# Patient Record
Sex: Female | Born: 1955 | Race: White | Hispanic: No | Marital: Single | Smoking: Never smoker
Health system: Southern US, Community
[De-identification: ages and names within clinical notes are randomized; demographics above are authoritative.]

---

## 1985-11-07 DIAGNOSIS — D229 Melanocytic nevi, unspecified: Secondary | ICD-10-CM

## 1985-11-07 HISTORY — DX: Melanocytic nevi, unspecified: D22.9

## 2017-02-01 DIAGNOSIS — C4491 Basal cell carcinoma of skin, unspecified: Secondary | ICD-10-CM

## 2017-02-01 HISTORY — DX: Basal cell carcinoma of skin, unspecified: C44.91

## 2019-05-22 DIAGNOSIS — C439 Malignant melanoma of skin, unspecified: Secondary | ICD-10-CM

## 2019-05-22 HISTORY — DX: Malignant melanoma of skin, unspecified: C43.9

## 2019-10-24 ENCOUNTER — Other Ambulatory Visit: Payer: Self-pay

## 2019-10-24 ENCOUNTER — Encounter: Payer: Self-pay | Admitting: Dermatology

## 2019-10-24 ENCOUNTER — Ambulatory Visit: Payer: BC Managed Care – PPO | Admitting: Dermatology

## 2019-10-24 DIAGNOSIS — Z86018 Personal history of other benign neoplasm: Secondary | ICD-10-CM

## 2019-10-24 DIAGNOSIS — Z85820 Personal history of malignant melanoma of skin: Secondary | ICD-10-CM

## 2019-10-24 DIAGNOSIS — D225 Melanocytic nevi of trunk: Secondary | ICD-10-CM | POA: Diagnosis not present

## 2019-10-24 DIAGNOSIS — D229 Melanocytic nevi, unspecified: Secondary | ICD-10-CM

## 2019-10-24 NOTE — Progress Notes (Signed)
   Follow-Up Visit   Subjective  Jacqueline Garcia is a 64 y.o. female who presents for the following: Follow-up (3 month follow up on right upper back-healing great).  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  General skin examination performed. No sign recurrent/new melanoma, no atypical moles   Assessment & Plan  Nevus (3) Left Upper Back; Right Upper Back; Mid Back  Recheck q41months x48yrs then annually. Patient to self examine skin twice annually. Sun protect.

## 2019-10-26 ENCOUNTER — Encounter: Payer: Self-pay | Admitting: Dermatology

## 2020-10-01 ENCOUNTER — Encounter: Payer: Self-pay | Admitting: Dermatology

## 2020-10-01 ENCOUNTER — Other Ambulatory Visit: Payer: Self-pay

## 2020-10-01 ENCOUNTER — Ambulatory Visit: Payer: BC Managed Care – PPO | Admitting: Dermatology

## 2020-10-01 DIAGNOSIS — Z86018 Personal history of other benign neoplasm: Secondary | ICD-10-CM

## 2020-10-01 DIAGNOSIS — L821 Other seborrheic keratosis: Secondary | ICD-10-CM

## 2020-10-01 DIAGNOSIS — Z87898 Personal history of other specified conditions: Secondary | ICD-10-CM

## 2020-10-01 DIAGNOSIS — Z85828 Personal history of other malignant neoplasm of skin: Secondary | ICD-10-CM

## 2020-10-01 DIAGNOSIS — Z85820 Personal history of malignant melanoma of skin: Secondary | ICD-10-CM

## 2020-10-01 DIAGNOSIS — D1801 Hemangioma of skin and subcutaneous tissue: Secondary | ICD-10-CM

## 2020-10-01 DIAGNOSIS — Z1283 Encounter for screening for malignant neoplasm of skin: Secondary | ICD-10-CM | POA: Diagnosis not present

## 2020-10-01 DIAGNOSIS — L859 Epidermal thickening, unspecified: Secondary | ICD-10-CM

## 2020-10-01 NOTE — Patient Instructions (Signed)
10% urea cream over the counter for elbows or amlactin lotion

## 2020-10-19 ENCOUNTER — Encounter: Payer: Self-pay | Admitting: Dermatology

## 2020-10-19 NOTE — Progress Notes (Signed)
   Follow-Up Visit   Subjective  Jacqueline Garcia is a 65 y.o. female who presents for the following: Annual Exam (Patient here today for yearly skin check. No new concerns).  General skin examination Location:  Duration:  Quality:  Associated Signs/Symptoms: Modifying Factors:  Severity:  Timing: Context:   Objective  Well appearing patient in no apparent distress; mood and affect are within normal limits. Objective  Right Upper Back: Previous melanoma Skin and Lymph nodes are clear per Dr. Denna Haggard  Objective  Chest - Medial [facility]): Full body skin check. No atypical moles, no skin cancer today. Many moles on face and neck. Facial lesion patient picks, sometimes bleeds, deferred treatment this visit  Objective  Mid Back: See previous history: No atypical pigmented lesions.  No recurrence.  Objective  Right Upper Back: 2018 right upper back: No sign recurrence  Objective  Left Upper Back: On back and chest: Flattopped brown textured 3 to 5 mm papules  Objective  Left Lower Back: On back, left outer eye lid, legs: Smooth 1 to 2 mm red papules  Objective  Left Elbow - Posterior, Right Elbow - Posterior: Moderate hyperkeratosis without much inflammation both elbows.  No other cutaneous or nail signs of psoriasis.   A full examination was performed including scalp, head, eyes, ears, nose, lips, neck, chest, axillae, abdomen, back, buttocks, bilateral upper extremities, bilateral lower extremities, hands, feet, fingers, toes, fingernails, and toenails. All findings within normal limits unless otherwise noted below.  Areas beneath undergarments not examined.   Assessment & Plan    Personal history of malignant melanoma of skin Right Upper Back  Annual skin examination, patient encouraged to self examine twice annually, continued ultraviolet protection.  Screening exam for skin cancer Chest - Medial [facility])  Annual skin examination.  Patient reassured that  picking will not change a mole into cancer.  History of atypical nevus Mid Back  Annual skin examination  History of basal cell carcinoma (BCC) Right Upper Back  Recheck as needed change  Seborrheic keratosis Left Upper Back  No intervention currently indicated  Cherry angioma Left Lower Back  No intervention necessary  Hyperkeratosis (2) Left Elbow - Posterior; Right Elbow - Posterior  May try over-the-counter 10% urea cream daily after bathing.     I, Lavonna Monarch, MD, have reviewed all documentation for this visit.  The documentation on 10/19/20 for the exam, diagnosis, procedures, and orders are all accurate and complete.

## 2021-03-08 ENCOUNTER — Encounter (HOSPITAL_BASED_OUTPATIENT_CLINIC_OR_DEPARTMENT_OTHER): Payer: Self-pay | Admitting: Emergency Medicine

## 2021-03-08 ENCOUNTER — Other Ambulatory Visit: Payer: Self-pay

## 2021-03-08 ENCOUNTER — Emergency Department (HOSPITAL_BASED_OUTPATIENT_CLINIC_OR_DEPARTMENT_OTHER)
Admission: EM | Admit: 2021-03-08 | Discharge: 2021-03-08 | Disposition: A | Discharge status: Home / Self Care | Payer: BC Managed Care – PPO | Attending: Emergency Medicine | Admitting: Emergency Medicine

## 2021-03-08 ENCOUNTER — Emergency Department (HOSPITAL_BASED_OUTPATIENT_CLINIC_OR_DEPARTMENT_OTHER): Payer: BC Managed Care – PPO

## 2021-03-08 DIAGNOSIS — J3489 Other specified disorders of nose and nasal sinuses: Secondary | ICD-10-CM | POA: Diagnosis not present

## 2021-03-08 DIAGNOSIS — Z85828 Personal history of other malignant neoplasm of skin: Secondary | ICD-10-CM | POA: Diagnosis not present

## 2021-03-08 DIAGNOSIS — J069 Acute upper respiratory infection, unspecified: Secondary | ICD-10-CM | POA: Diagnosis not present

## 2021-03-08 DIAGNOSIS — R059 Cough, unspecified: Secondary | ICD-10-CM | POA: Diagnosis present

## 2021-03-08 MED ORDER — AZITHROMYCIN 250 MG PO TABS: 250.0000 mg | ORAL_TABLET | Freq: Every day | ORAL | 0 refills | Status: DC

## 2021-03-08 MED ORDER — AZITHROMYCIN 250 MG PO TABS: 500.0000 mg | ORAL_TABLET | Freq: Once | ORAL | Status: DC

## 2021-03-08 MED ORDER — BENZONATATE 100 MG PO CAPS: 100.0000 mg | ORAL_CAPSULE | Freq: Three times a day (TID) | ORAL | 0 refills | Status: AC

## 2021-03-08 MED ORDER — AZITHROMYCIN 250 MG PO TABS
250.0000 mg | ORAL_TABLET | Freq: Every day | ORAL | 0 refills | Status: AC
Start: 2021-03-08 — End: 2021-03-12

## 2021-03-08 MED ORDER — DOXYCYCLINE HYCLATE 100 MG PO CAPS: 100.0000 mg | ORAL_CAPSULE | Freq: Two times a day (BID) | ORAL | 0 refills | Status: AC

## 2021-03-08 NOTE — ED Triage Notes (Signed)
Pt arrives pov with c/o cough x 2 weeks. Reports nasal congestion as well. Pt denies CP

## 2021-03-08 NOTE — ED Notes (Addendum)
Cough x 2 weeks, congestion

## 2021-03-08 NOTE — ED Provider Notes (Signed)
[facility] EMERGENCY DEPARTMENT Provider Note   CSN: [id number] Arrival date & time: 03/08/21  1447     History Chief Complaint  Patient presents with   Cough    Jacqueline Garcia is a 66 y.o. female.  65 y.o female with a PMH of Melanoma presents to the ED with a chief complaint of cough that is been ongoing for the past 3 weeks. Patient describes a cough as productive, with some green sputum.  Reports the coughing fits make it difficult for her to take a deep breath.  Therefore she endorses some shortness of breath that occurs when the coughing fits happen.  She has been taking DayQuil, NyQuil for symptomatic control without much improvement in her symptoms.  She does endorse some traveling, stating she went to [place], and on a cruise but did not come in contact with anyone sick that she is aware of.  In addition she also endorses some nasal congestion, sore throat.  She has taken 2 COVID tests at home, which have both been negative.  In addition, she is vaccinated against COVID-19 along with booster.  No fever, chest pain, leg swelling or other complaints.     The history is provided by the patient and medical records.  Cough Cough characteristics:  Dry and productive Sputum characteristics:  Green Severity:  Moderate Onset quality:  Gradual Duration:  3 weeks Timing:  Constant Progression:  Unchanged Chronicity:  New Smoker: no   Context: not animal exposure, not sick contacts and not upper respiratory infection   Relieved by:  Nothing Associated symptoms: rhinorrhea   Associated symptoms: no chest pain, no fever, no headaches, no myalgias and no sore throat       Past Medical History:  Diagnosis Date   Atypical mole 11/07/1985   slight left lower back   Atypical nevi 11/07/1985   slight right mid back   Atypical nevi 11/07/1985   slight right med chest   Atypical nevi 01/25/1996   slight right lower leg   Basal cell carcinoma 02/01/2017   BCC  Superficial Right Back TX CURET CAUTERY 5FU   Melanoma ([place]) 05/22/2019   MM II- Right upper back- exc    There are no problems to display for this patient.   History reviewed. No pertinent surgical history.   OB History   No obstetric history on file.     History reviewed. No pertinent family history.  Social History   Tobacco Use   Smoking status: Never   Smokeless tobacco: Never  Vaping Use   Vaping Use: Never used  Substance Use Topics   Alcohol use: Never   Drug use: Never    Home Medications Prior to Admission medications   Medication Sig Start Date End Date Taking? Authorizing Provider  benzonatate (TESSALON) 100 MG capsule Take 1 capsule (100 mg total) by mouth every 8 (eight) hours for 7 days. 03/08/21 03/15/21 Yes Dimonique Bourdeau, Beverley Fiedler, PA-C  doxycycline (VIBRAMYCIN) 100 MG capsule Take 1 capsule (100 mg total) by mouth 2 (two) times daily for 7 days. 03/08/21 03/15/21 Yes Edouard Gikas, PA-C  azithromycin (ZITHROMAX) 250 MG tablet Take 1 tablet (250 mg total) by mouth daily for 4 days. Take first 2 tablets together, then 1 every day until finished. 03/08/21 03/12/21  Janeece Fitting, PA-C  Multiple Vitamin (QUINTABS) TABS Take 1 tablet by mouth daily.    [provider]    Allergies    Patient has no known allergies.  Review of Systems  Review of Systems  Constitutional:  Negative for fever.  HENT:  Positive for rhinorrhea. Negative for sore throat.   Respiratory:  Positive for cough.   Cardiovascular:  Negative for chest pain.  Musculoskeletal:  Negative for myalgias.  Neurological:  Negative for headaches.   Physical Exam Updated Vital Signs BP (!) 151/77 (BP Location: Left Arm)   Pulse 72   Temp 99.1 F (37.3 C) (Oral)   Resp 18   Ht '5\' 4"'$  (1.626 m)   Wt 108.9 kg   SpO2 98%   BMI 41.20 kg/m   Physical Exam Vitals and nursing note reviewed.  Constitutional:      Appearance: Normal appearance. She is not ill-appearing.     Comments: Non toxic  appearance  HENT:     Head: Normocephalic and atraumatic.     Nose: Nose normal.     Right Turbinates: Not enlarged.     Left Turbinates: Enlarged.     Right Sinus: No maxillary sinus tenderness or frontal sinus tenderness.     Left Sinus: No maxillary sinus tenderness or frontal sinus tenderness.     Mouth/Throat:     Mouth: Mucous membranes are moist.     Comments: Oropharynx appears erythematous, without any tonsillar exudate, uvula is midline.  No PTA noted. Eyes:     Pupils: Pupils are equal, round, and reactive to light.  Cardiovascular:     Rate and Rhythm: Normal rate.  Pulmonary:     Effort: Pulmonary effort is normal.     Breath sounds: Normal breath sounds. No wheezing or rales.  Abdominal:     General: Abdomen is flat.     Tenderness: There is no abdominal tenderness.  Musculoskeletal:     Cervical back: Normal range of motion and neck supple.  Skin:    General: Skin is warm and dry.  Neurological:     Mental Status: She is alert and oriented to person, place, and time.    ED Results / Procedures / Treatments   Labs (all labs ordered are listed, but only abnormal results are displayed) Labs Reviewed - No data to display  EKG None  Radiology DG Chest 2 View  Result Date: 03/08/2021 CLINICAL DATA:  Cough for 3 weeks. EXAM: CHEST - 2 VIEW COMPARISON:  None. FINDINGS: Heart size and mediastinal contours are within normal limits. Lungs are clear. No pleural effusion or pneumothorax is seen. Osseous structures about the chest are unremarkable. IMPRESSION: No active cardiopulmonary disease. No evidence of pneumonia or pulmonary edema. Electronically Signed   By: Franki Cabot M.D.   On: 03/08/2021 16:28    Procedures Procedures   Medications Ordered in ED Medications - No data to display   ED Course  I have reviewed the triage vital signs and the nursing notes.  Pertinent labs & imaging results that were available during my care of the patient were reviewed by  me and considered in my medical decision making (see chart for details).    MDM Rules/Calculators/A&P   Patient with no pertinent past medical history presents to the ED with a chief complaint of cough for the past 3 weeks.  Patient Dors is a productive cough with some green sputum.  Has done a decent amount of traveling in the last couple months including [place], [place] O[place] reports she has not been in contact with anyone sick that she is aware of.  She is COVID vaccinated along with boosted.  She does not endorse a fever, or chest pain but  has had some shortness of breath when the coughing fits occur.  She is tried over-the-counter measurements without improvement in symptoms.  She arrived in the ED with stable vital signs, temperature is 99.1, reports she is had no fevers at home.  Oxygen saturation is 98% on room air, she is able to speak in full sentences without any tachypnea present.  Lungs are clear to auscultation without any obvious crackles or rales noted.  There is no sinus pressure, oropharynx is erythematous without any tonsillar exudate or PTA.  We discussed chest x-ray to further evaluate and rule out pneumonia. X-rays without any signs of pneumonia, no pneumothorax, no other signs of infection.  Vitals are stable, she is not tachycardic or hypoxic with no history of prior blood clots.  Patient symptoms have been ongoing for the past 3 weeks, with some green sputum.  We will treat for CAP this time with along with Tessalon Perles to help with symptomatic treatment of her cough.She is agreeable of plan and management.  Patient stable for discharge.   Portions of this note were generated with [employer]. Dictation errors may occur despite best attempts at proofreading.  Final Clinical Impression(s) / ED Diagnoses Final diagnoses:  Acute upper respiratory infection    Rx / DC Orders ED Discharge Orders          Ordered    azithromycin (ZITHROMAX) 250 MG tablet  Daily,    Status:  Discontinued        03/08/21 1640    benzonatate (TESSALON) 100 MG capsule  Every 8 hours        03/08/21 1640    azithromycin (ZITHROMAX) 250 MG tablet  Daily        03/08/21 1642    doxycycline (VIBRAMYCIN) 100 MG capsule  2 times daily        03/08/21 1642             Janeece Fitting, PA-C 03/08/21 1643    Long, Wonda Olds, MD 03/09/21 [phone]

## 2021-03-08 NOTE — Discharge Instructions (Addendum)
X-ray of your chest did not show any signs of pneumonia.  You may continue to treat your cough with symptomatic medication.  I have prescribed a short course of medication to help with the cough.

## 2021-10-08 ENCOUNTER — Other Ambulatory Visit: Payer: Self-pay

## 2021-10-08 ENCOUNTER — Ambulatory Visit: Payer: BC Managed Care – PPO | Admitting: Dermatology

## 2021-10-08 DIAGNOSIS — Z1283 Encounter for screening for malignant neoplasm of skin: Secondary | ICD-10-CM

## 2021-10-08 DIAGNOSIS — Z86018 Personal history of other benign neoplasm: Secondary | ICD-10-CM

## 2021-10-08 DIAGNOSIS — L821 Other seborrheic keratosis: Secondary | ICD-10-CM

## 2021-10-08 DIAGNOSIS — Z85828 Personal history of other malignant neoplasm of skin: Secondary | ICD-10-CM

## 2021-10-08 DIAGNOSIS — B079 Viral wart, unspecified: Secondary | ICD-10-CM

## 2021-10-08 DIAGNOSIS — D1801 Hemangioma of skin and subcutaneous tissue: Secondary | ICD-10-CM

## 2021-10-08 DIAGNOSIS — Z85820 Personal history of malignant melanoma of skin: Secondary | ICD-10-CM

## 2021-10-08 DIAGNOSIS — Z87898 Personal history of other specified conditions: Secondary | ICD-10-CM

## 2021-10-08 NOTE — Patient Instructions (Addendum)
ENJOY YOUR TRIP!!!!!! ? ?[address] DR. TAFEEN STATED, A WIDE BRIM HAT AND SUN PROTECTIVE CLOTHING. ?

## 2021-10-21 ENCOUNTER — Encounter: Payer: Self-pay | Admitting: Dermatology

## 2021-10-21 NOTE — Progress Notes (Signed)
? ?  Follow-Up Visit ?  ?Subjective  ?Jacqueline Garcia is a 66 y.o. female who presents for the following: Annual Exam (Pt here for annual exam. Pt has hx of MM. Pt has no other concerns). ? ?General skin examination, history of melanoma and nonmelanoma skin cancers ?Location:  ?Duration:  ?Quality:  ?Associated Signs/Symptoms: ?Modifying Factors:  ?Severity:  ?Timing: ?Context:  ? ?Objective  ?Well appearing patient in no apparent distress; mood and affect are within normal limits. ?Full Body ?General skin examination, no atypical pigmented lesions or nonmelanoma skin cancer. ? ?Dozen 3 to 10 mm flattopped brown textured papules with typical dermoscopy ? ?Multiple 1 to 2 mm smooth red dermal papules ? ?Right Upper Back ?No sign repigmentation.  No regional adenopathy. ? ?Right Upper Back ?White scar, no sign of residual BCC ? ?Mid Back ?All pigmented spots checked with dermoscopy.  None atypical. ? ?Left Dorsal Hand ?Verrucous 4 mm flesh-colored papule ? ? ? ?A full examination was performed including scalp, head, eyes, ears, nose, lips, neck, chest, axillae, abdomen, back, buttocks, bilateral upper extremities, bilateral lower extremities, hands, feet, fingers, toes, fingernails, and toenails. All findings within normal limits unless otherwise noted below.  Areas beneath undergarments not fully examined. ? ? ?Assessment & Plan  ? ? ?Screening exam for skin cancer ?Full Body ? ?Annual skin examination.  Encouraged to self examine twice annually.  Continued ultraviolet protection. ? ?Personal history of malignant melanoma of skin ?Right Upper Back ? ?Annual skin examination ? ?History of basal cell carcinoma ?Right Upper Back ? ?Check.  As needed change ? ?History of atypical nevus ?Mid Back ? ?Annual examination ? ?Seborrheic keratosis ? ?Leave if stable ? ?Cherry angioma ? ?No intervention indicated ? ?Verruca vulgaris ?Left Dorsal Hand ? ?May try home freezing or leave if stable. ? ? ? ? ? ?I, Lavonna Monarch, MD,  have reviewed all documentation for this visit.  The documentation on 10/21/21 for the exam, diagnosis, procedures, and orders are all accurate and complete. ?

## 2022-03-28 IMAGING — DX DG CHEST 2V
2 series · 2 of 2 positions shown · non-contrast
Comparison: None.

CLINICAL DATA: Cough for 3 weeks.

EXAM:
CHEST - 2 VIEW

[chest pa]
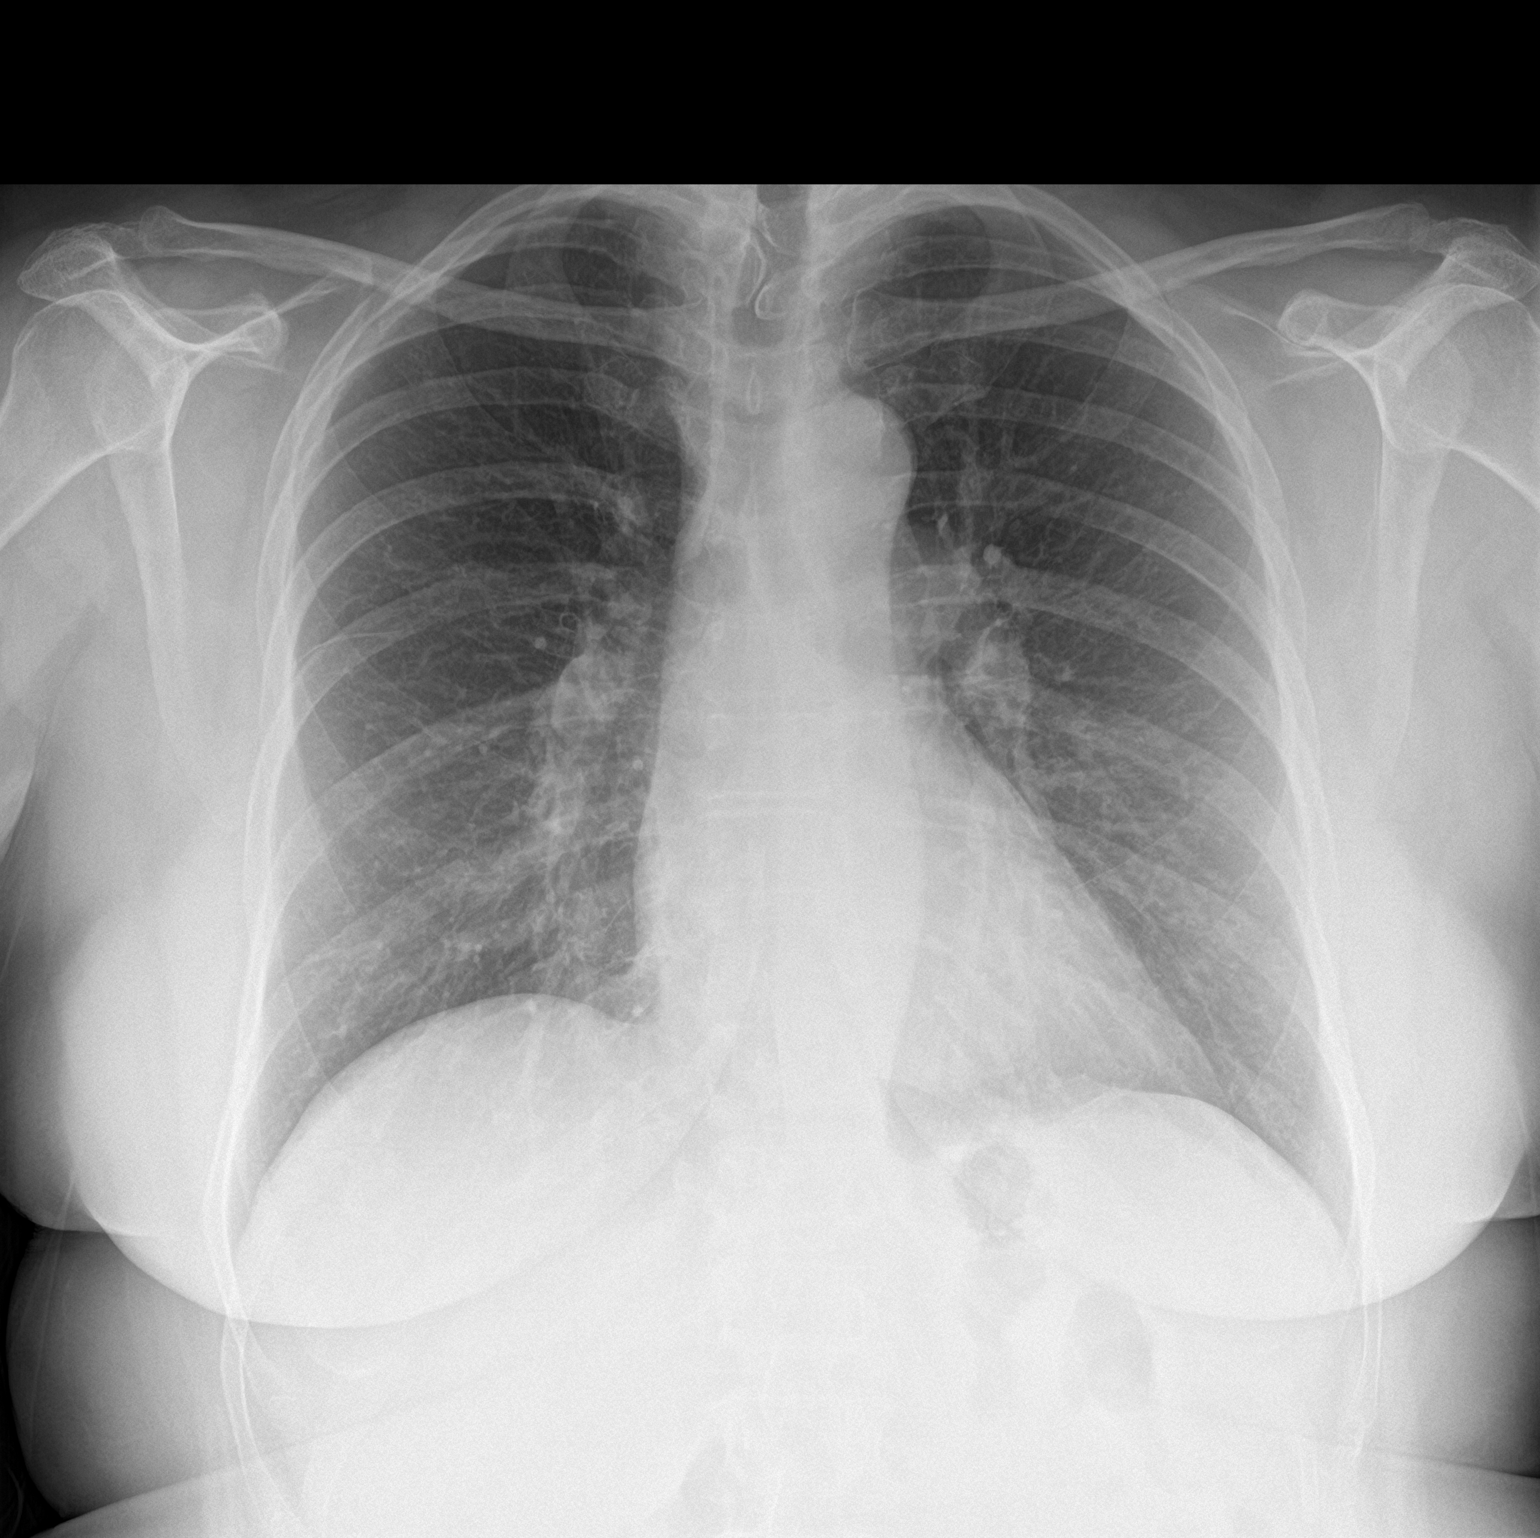

[chest lat]
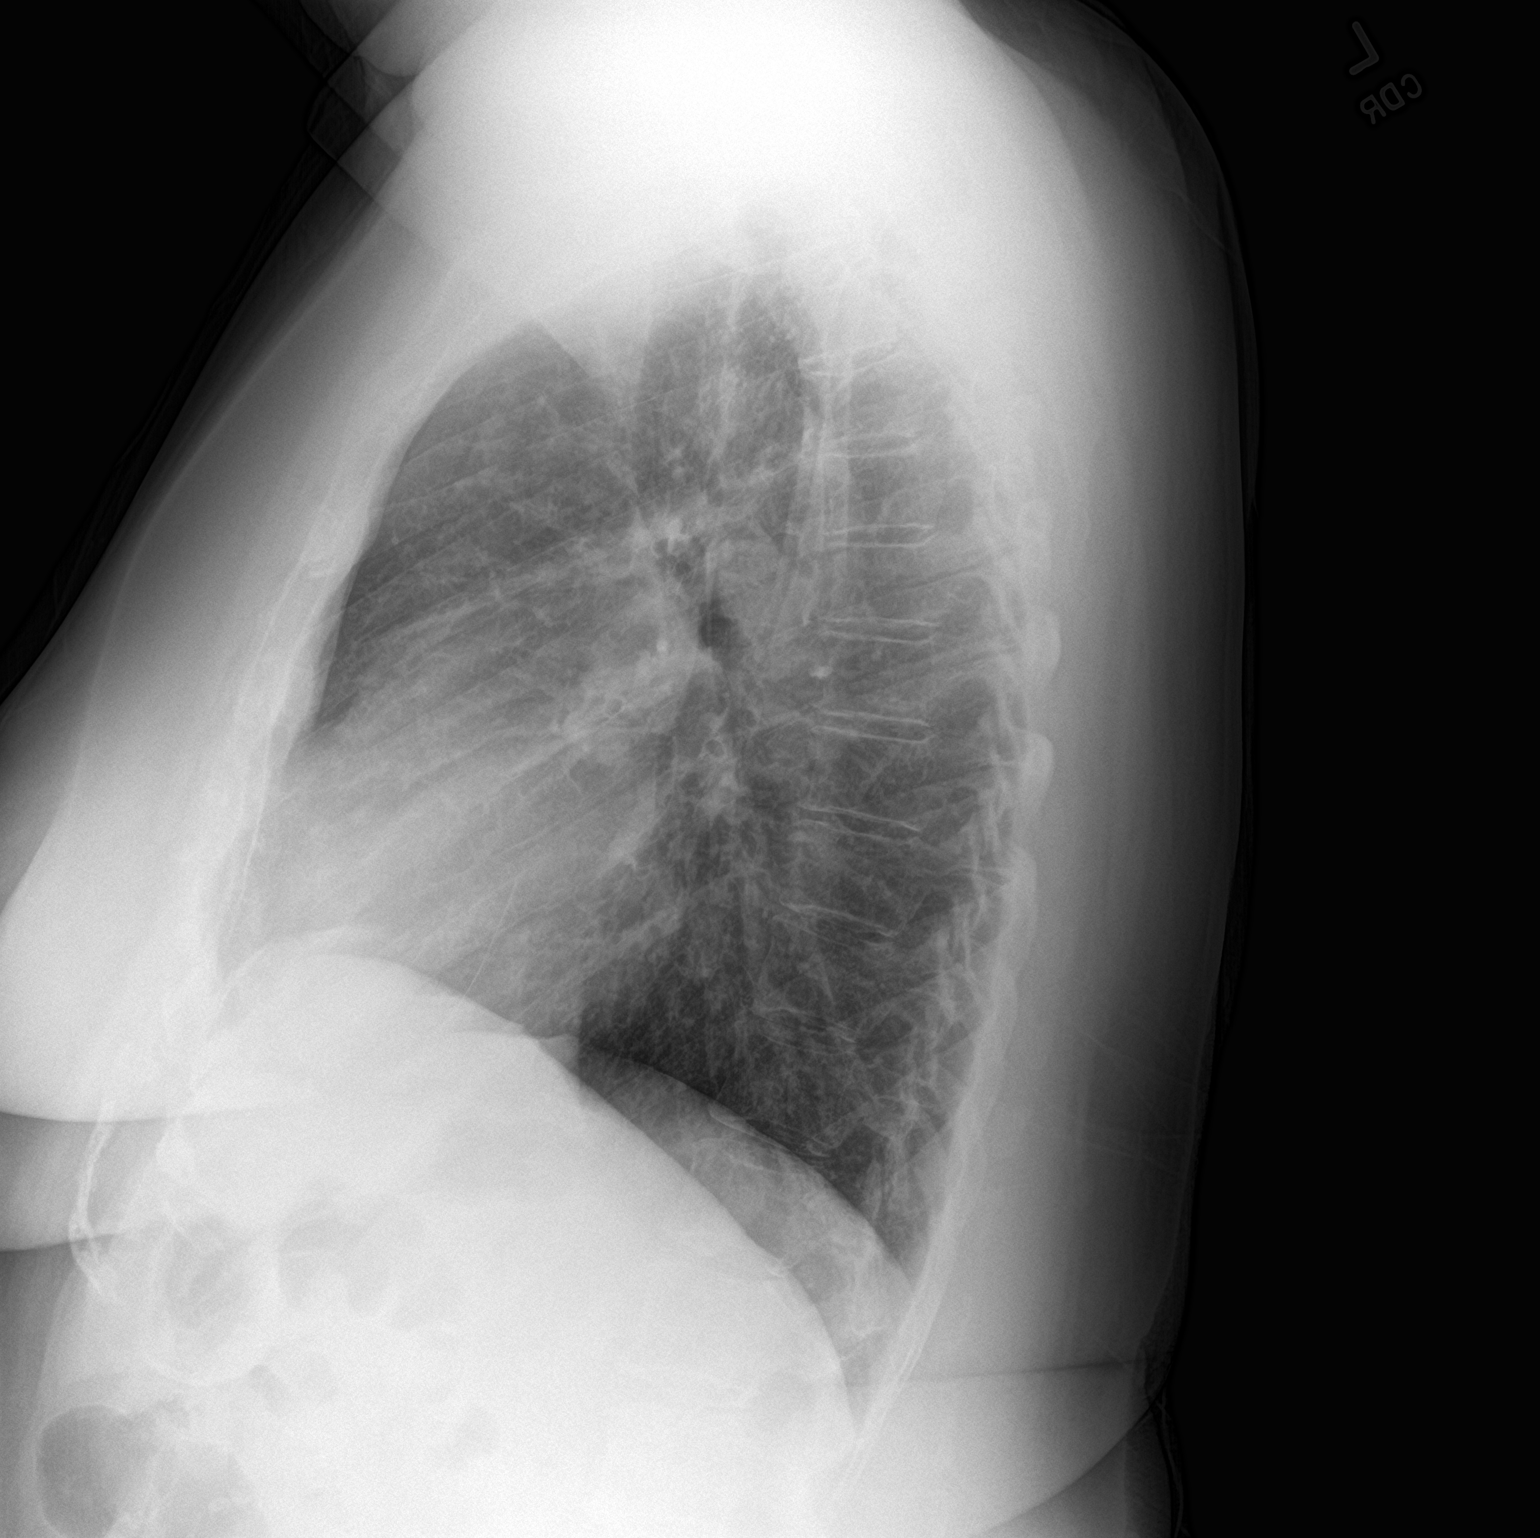

[2 of 2 positions shown; findings below may reference images not displayed]

FINDINGS: Heart size and mediastinal contours are within normal limits. Lungs
are clear. No pleural effusion or pneumothorax is seen. Osseous
structures about the chest are unremarkable.
IMPRESSION: No active cardiopulmonary disease. No evidence of pneumonia or
pulmonary edema.

## 2022-10-19 ENCOUNTER — Ambulatory Visit: Payer: BC Managed Care – PPO | Admitting: Dermatology
